# Patient Record
Sex: Male | Born: 2000 | Race: Black or African American | Hispanic: No | Marital: Single | Smoking: Never smoker
Health system: Southern US, Community
[De-identification: ages and names within clinical notes are randomized; demographics above are authoritative.]

---
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
## 2021-04-19 IMAGING — DX DG CERVICAL SPINE COMPLETE 4+V
5 series · 5 of 5 positions shown · non-contrast
Comparison: None.

CLINICAL DATA: 18-year-old male with concern for malalignment.

EXAM:
CERVICAL SPINE - COMPLETE 4+ VIEW

[c-spine lat]
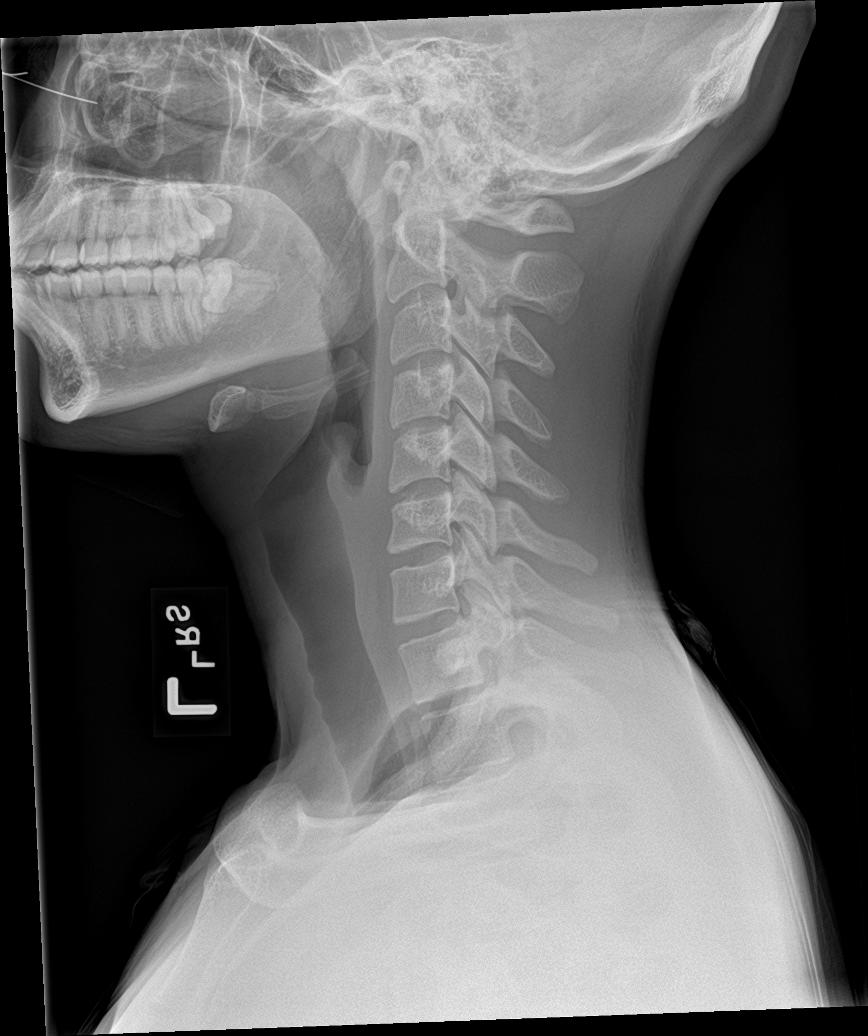

[c-spine obl (1 of 2)]
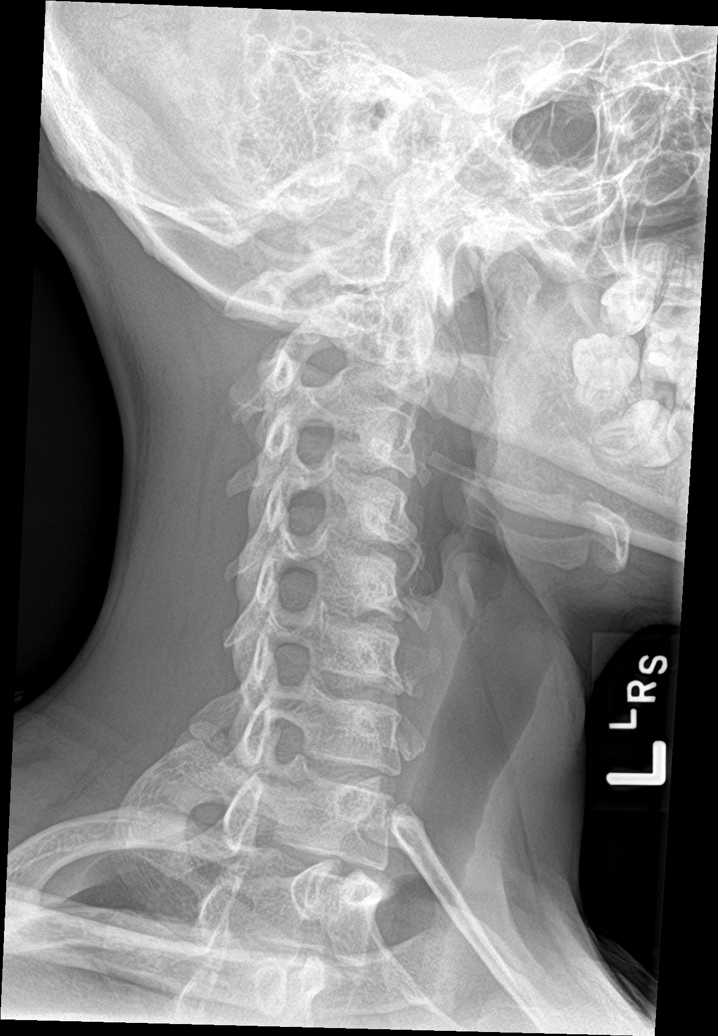

[c-spine obl (2 of 2)]
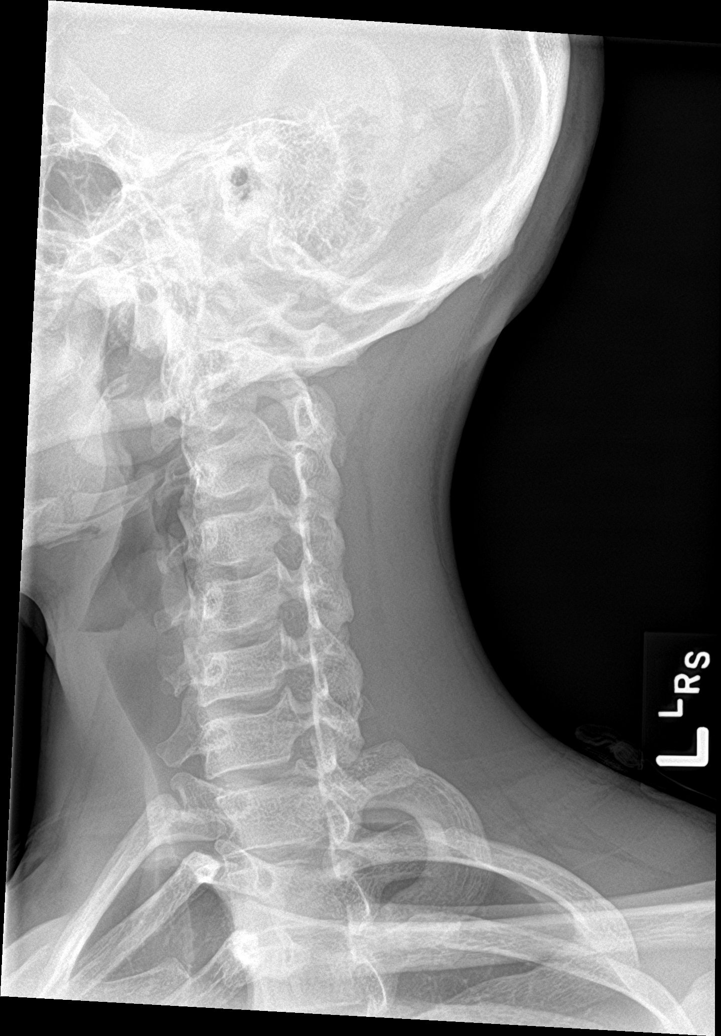

[c-spine ap]
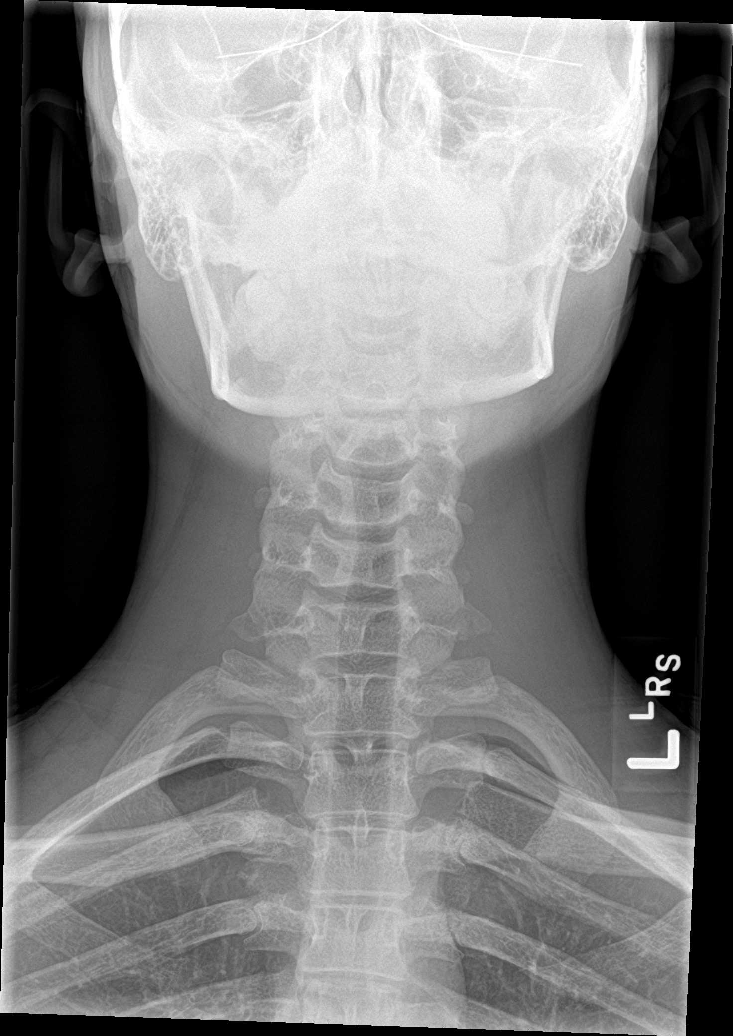

[c-spine open mouth]
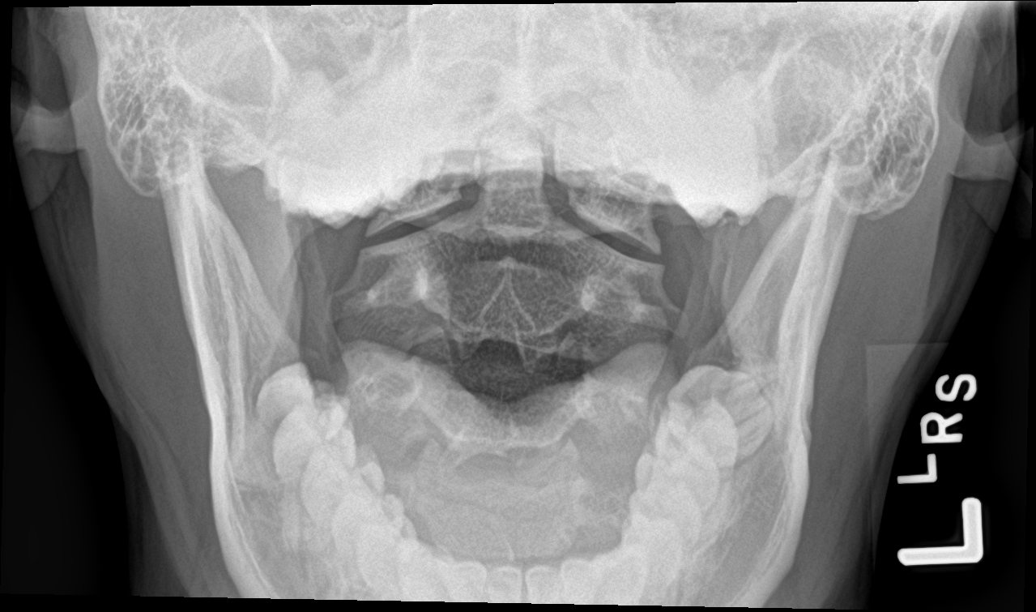

[5 of 5 positions shown; findings below may reference images not displayed]

FINDINGS: There is no acute fracture or subluxation of the cervical spine.
Straightening of normal cervical lordosis may be positional or due
to muscle spasm. The vertebral body heights and disc spaces are
maintained. The visualized posterior elements and odontoid appear
intact. There is anatomic alignment of the lateral masses of C1 and
C2. The neural foramina patent. The soft tissues are unremarkable.
IMPRESSION: Negative cervical spine radiographs.

## 2021-04-19 IMAGING — DX DG CHEST 2V
2 series · 2 of 2 positions shown · non-contrast
Comparison: None.

CLINICAL DATA: 18-year-old male.  Evaluate for malalignment.

EXAM:
CHEST - 2 VIEW

[chest pa]
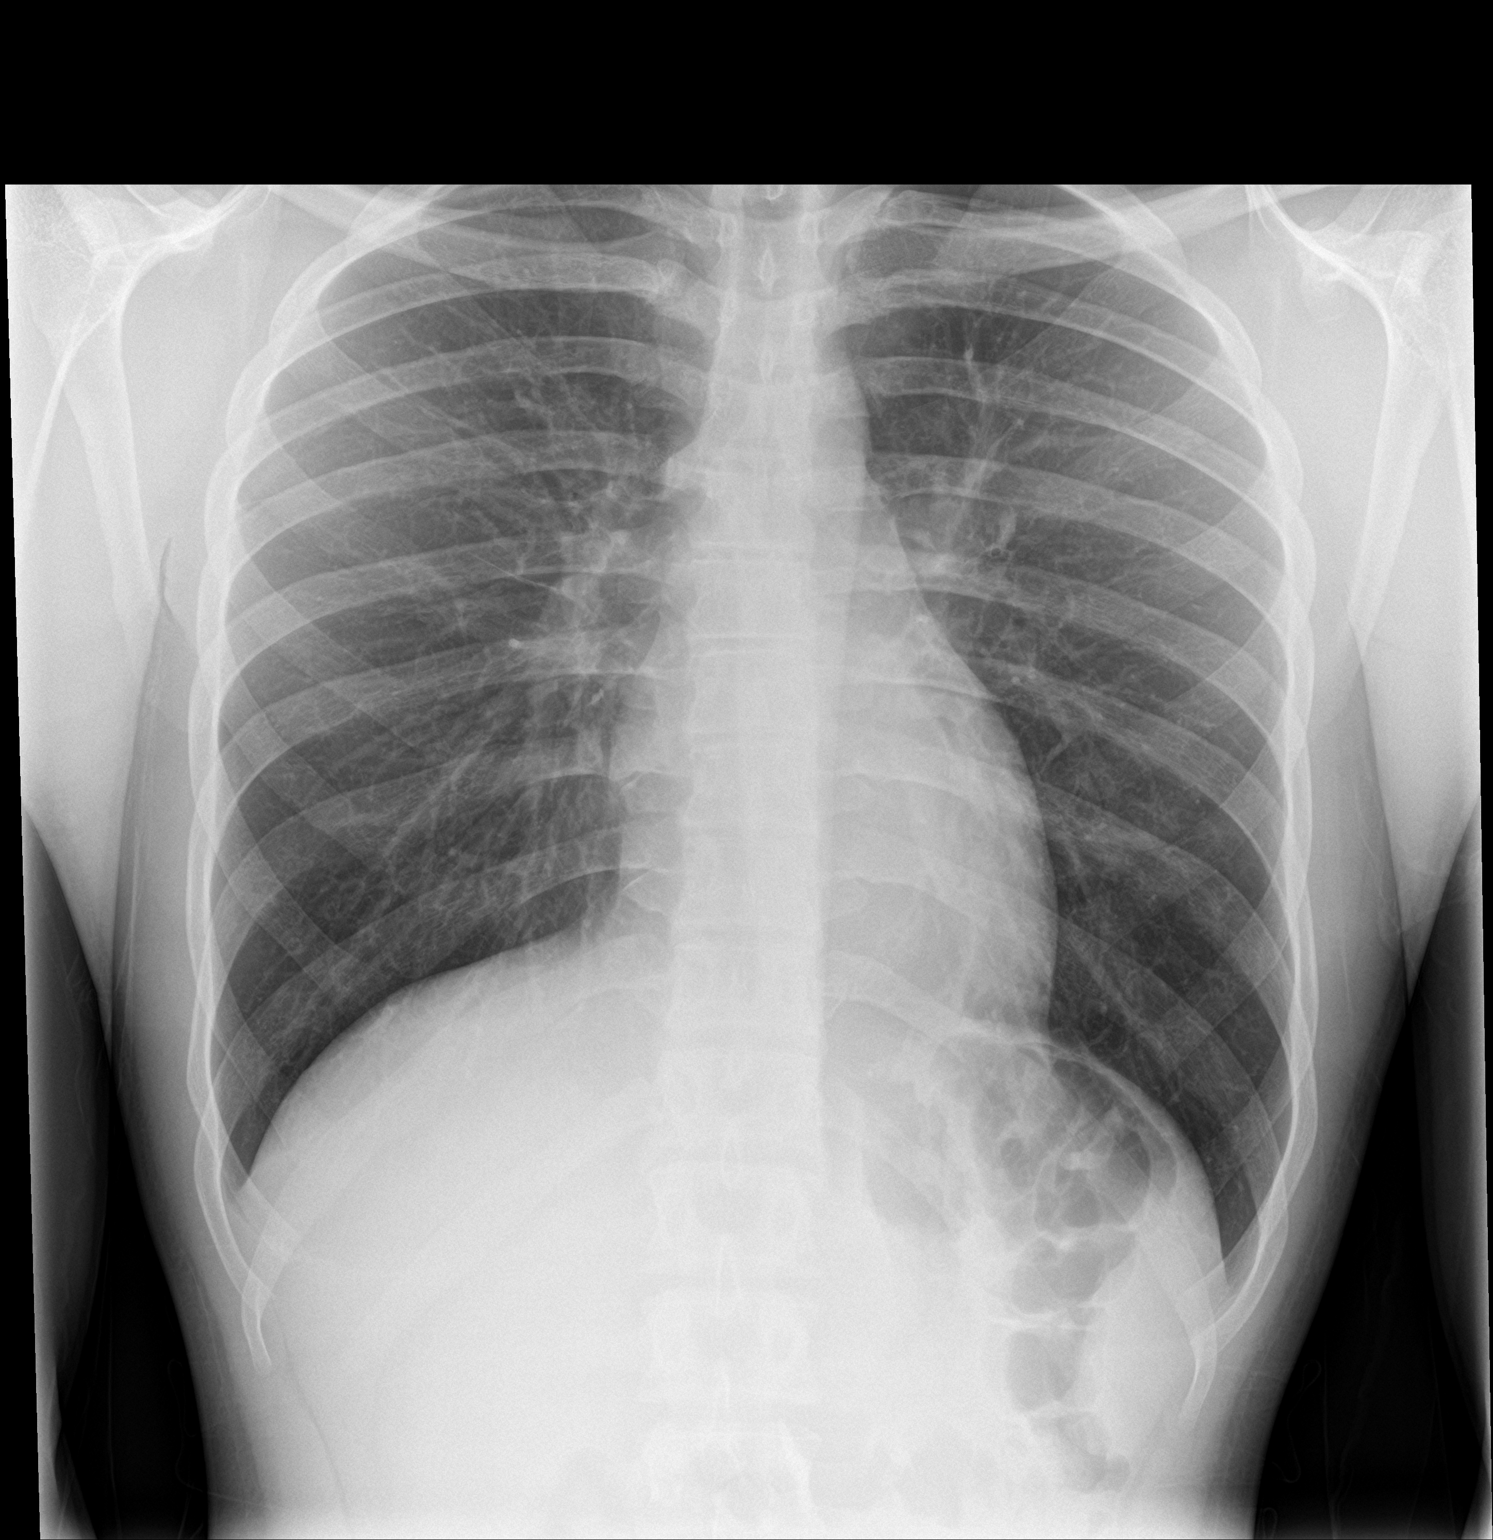

[chest lat]
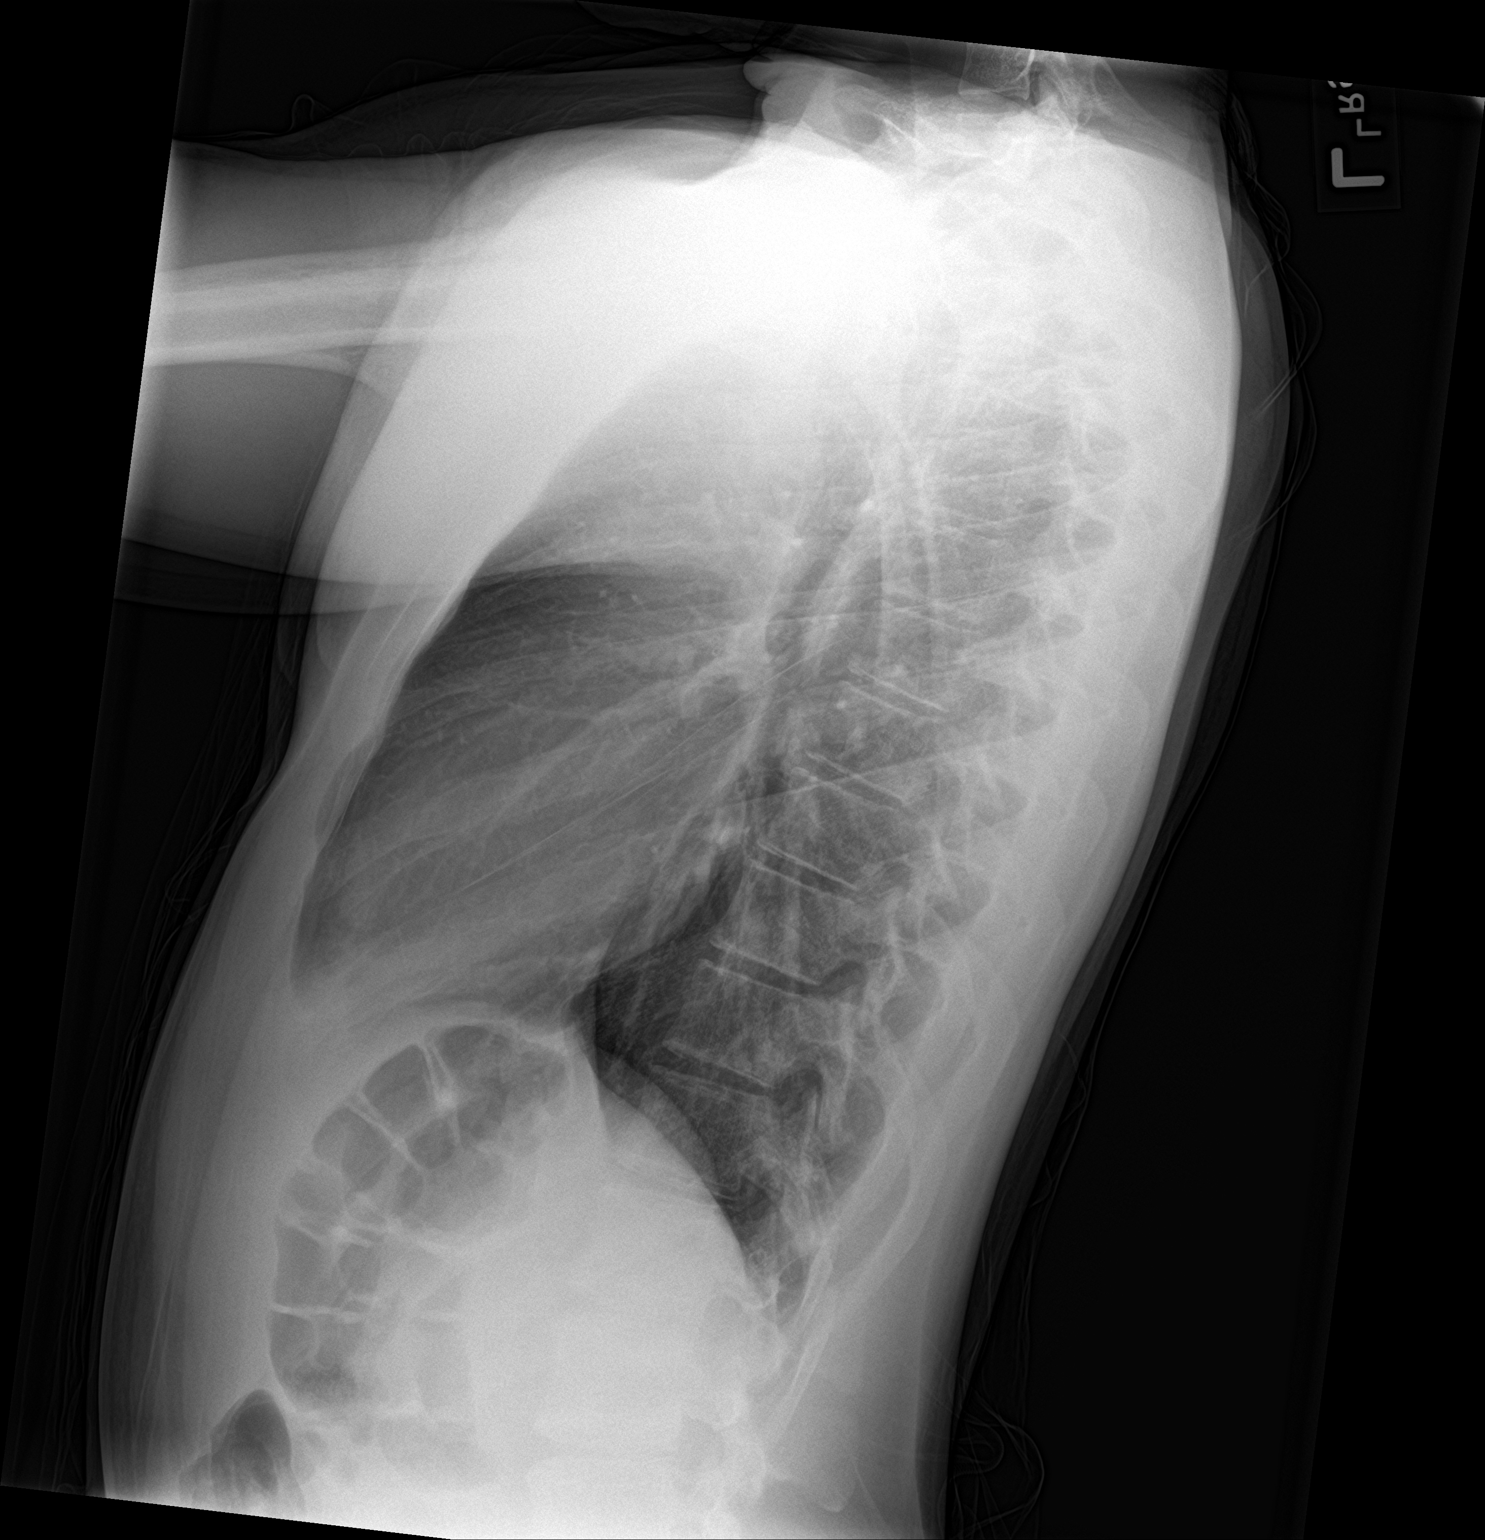

[2 of 2 positions shown; findings below may reference images not displayed]

FINDINGS: The heart size and mediastinal contours are within normal limits.
Both lungs are clear. The visualized skeletal structures are
unremarkable.
IMPRESSION: No active cardiopulmonary disease.
